# Patient Record
Sex: Male | Born: 1975
Health system: Southern US, Community
[De-identification: ages and names within clinical notes are randomized; demographics above are authoritative.]

## PROBLEM LIST (undated history)

## (undated) DIAGNOSIS — N200 Calculus of kidney: Secondary | ICD-10-CM

## (undated) HISTORY — PX: APPENDECTOMY: SHX54

## (undated) HISTORY — DX: Calculus of kidney: N20.0

---

## 2019-07-12 DIAGNOSIS — E785 Hyperlipidemia, unspecified: Secondary | ICD-10-CM | POA: Diagnosis not present

## 2019-07-12 DIAGNOSIS — E559 Vitamin D deficiency, unspecified: Secondary | ICD-10-CM | POA: Diagnosis not present

## 2019-08-16 DIAGNOSIS — E663 Overweight: Secondary | ICD-10-CM | POA: Diagnosis not present

## 2019-08-16 DIAGNOSIS — Z6828 Body mass index (BMI) 28.0-28.9, adult: Secondary | ICD-10-CM | POA: Diagnosis not present

## 2019-08-16 DIAGNOSIS — Z0000 Encounter for general adult medical examination without abnormal findings: Secondary | ICD-10-CM | POA: Diagnosis not present

## 2019-08-16 DIAGNOSIS — Z1331 Encounter for screening for depression: Secondary | ICD-10-CM | POA: Diagnosis not present

## 2020-02-28 ENCOUNTER — Other Ambulatory Visit: Payer: Self-pay

## 2020-02-28 ENCOUNTER — Encounter (HOSPITAL_COMMUNITY): Payer: Self-pay | Admitting: Emergency Medicine

## 2020-02-28 DIAGNOSIS — N202 Calculus of kidney with calculus of ureter: Secondary | ICD-10-CM | POA: Diagnosis not present

## 2020-02-28 DIAGNOSIS — R1031 Right lower quadrant pain: Secondary | ICD-10-CM | POA: Diagnosis not present

## 2020-02-28 DIAGNOSIS — I2510 Atherosclerotic heart disease of native coronary artery without angina pectoris: Secondary | ICD-10-CM | POA: Diagnosis not present

## 2020-02-28 DIAGNOSIS — R030 Elevated blood-pressure reading, without diagnosis of hypertension: Secondary | ICD-10-CM | POA: Diagnosis not present

## 2020-02-28 DIAGNOSIS — I700 Atherosclerosis of aorta: Secondary | ICD-10-CM | POA: Diagnosis not present

## 2020-02-28 DIAGNOSIS — N289 Disorder of kidney and ureter, unspecified: Secondary | ICD-10-CM | POA: Diagnosis not present

## 2020-02-28 DIAGNOSIS — R93421 Abnormal radiologic findings on diagnostic imaging of right kidney: Secondary | ICD-10-CM | POA: Diagnosis not present

## 2020-02-28 DIAGNOSIS — R7302 Impaired glucose tolerance (oral): Secondary | ICD-10-CM | POA: Diagnosis not present

## 2020-02-28 DIAGNOSIS — R1084 Generalized abdominal pain: Secondary | ICD-10-CM | POA: Diagnosis not present

## 2020-02-28 DIAGNOSIS — K5900 Constipation, unspecified: Secondary | ICD-10-CM | POA: Diagnosis not present

## 2020-02-28 DIAGNOSIS — K3189 Other diseases of stomach and duodenum: Secondary | ICD-10-CM | POA: Diagnosis not present

## 2020-02-28 DIAGNOSIS — K760 Fatty (change of) liver, not elsewhere classified: Secondary | ICD-10-CM | POA: Diagnosis not present

## 2020-02-28 DIAGNOSIS — N201 Calculus of ureter: Secondary | ICD-10-CM | POA: Insufficient documentation

## 2020-02-28 DIAGNOSIS — N23 Unspecified renal colic: Secondary | ICD-10-CM | POA: Diagnosis not present

## 2020-02-28 DIAGNOSIS — K5901 Slow transit constipation: Secondary | ICD-10-CM | POA: Diagnosis not present

## 2020-02-28 DIAGNOSIS — E785 Hyperlipidemia, unspecified: Secondary | ICD-10-CM | POA: Diagnosis not present

## 2020-02-28 DIAGNOSIS — N2882 Megaloureter: Secondary | ICD-10-CM | POA: Diagnosis not present

## 2020-02-28 LAB — BASIC METABOLIC PANEL
Anion gap: 15 (ref 5–15)
BUN: 19 mg/dL (ref 6–20)
CO2: 24 mmol/L (ref 22–32)
Calcium: 9.7 mg/dL (ref 8.9–10.3)
Chloride: 97 mmol/L — ABNORMAL LOW (ref 98–111)
Creatinine, Ser: 1.6 mg/dL — ABNORMAL HIGH (ref 0.61–1.24)
GFR, Estimated: 52 mL/min — ABNORMAL LOW (ref >60)
Glucose, Bld: 94 mg/dL (ref 70–99)
Potassium: 4 mmol/L (ref 3.5–5.1)
Sodium: 136 mmol/L (ref 135–145)

## 2020-02-28 LAB — CBC
HCT: 46.9 % (ref 39.0–52.0)
Hemoglobin: 15.8 g/dL (ref 13.0–17.0)
MCH: 30.6 pg (ref 26.0–34.0)
MCHC: 33.7 g/dL (ref 30.0–36.0)
MCV: 90.7 fL (ref 80.0–100.0)
Platelets: 220 10*3/uL (ref 150–400)
RBC: 5.17 MIL/uL (ref 4.22–5.81)
RDW: 12.8 % (ref 11.5–15.5)
WBC: 12.3 10*3/uL — ABNORMAL HIGH (ref 4.0–10.5)
nRBC: 0 % (ref 0.0–0.2)

## 2020-02-28 LAB — URINALYSIS, ROUTINE W REFLEX MICROSCOPIC
Bilirubin Urine: NEGATIVE
Glucose, UA: NEGATIVE mg/dL
Hgb urine dipstick: NEGATIVE
Ketones, ur: 20 mg/dL — AB
Leukocytes,Ua: NEGATIVE
Nitrite: NEGATIVE
Protein, ur: NEGATIVE mg/dL
Specific Gravity, Urine: 1.015 (ref 1.005–1.030)
pH: 5 (ref 5.0–8.0)

## 2020-02-28 NOTE — ED Triage Notes (Signed)
Patient is stating that he went to his PCP. Patient states he had a ct done. Patient states that he has 5 mm stone on right flank pain.

## 2020-02-29 ENCOUNTER — Emergency Department (HOSPITAL_COMMUNITY)
Admission: EM | Admit: 2020-02-29 | Discharge: 2020-02-29 | Disposition: A | Discharge status: Home / Self Care | Payer: BC Managed Care – PPO | Attending: Emergency Medicine | Admitting: Emergency Medicine

## 2020-02-29 DIAGNOSIS — R030 Elevated blood-pressure reading, without diagnosis of hypertension: Secondary | ICD-10-CM

## 2020-02-29 DIAGNOSIS — N201 Calculus of ureter: Secondary | ICD-10-CM | POA: Diagnosis not present

## 2020-02-29 DIAGNOSIS — N289 Disorder of kidney and ureter, unspecified: Secondary | ICD-10-CM

## 2020-02-29 MED ORDER — ONDANSETRON HCL 4 MG PO TABS: 4.0000 mg | ORAL_TABLET | Freq: Four times a day (QID) | ORAL | 0 refills | Status: DC | PRN

## 2020-02-29 MED ORDER — BISACODYL 5 MG PO TBEC
5.0000 mg | DELAYED_RELEASE_TABLET | Freq: Once | ORAL | Status: AC
  Administered 2020-02-29: 5 mg via ORAL
  Filled 2020-02-29: qty 1

## 2020-02-29 MED ORDER — OXYCODONE-ACETAMINOPHEN 5-325 MG PO TABS
1.0000 | ORAL_TABLET | Freq: Once | ORAL | Status: AC
  Administered 2020-02-29: 1 via ORAL
  Filled 2020-02-29: qty 1

## 2020-02-29 MED ORDER — TAMSULOSIN HCL 0.4 MG PO CAPS: 0.4000 mg | ORAL_CAPSULE | Freq: Every day | ORAL | 0 refills | Status: DC

## 2020-02-29 MED ORDER — ONDANSETRON HCL 4 MG PO TABS
4.0000 mg | ORAL_TABLET | Freq: Once | ORAL | Status: AC
  Administered 2020-02-29: 4 mg via ORAL
  Filled 2020-02-29: qty 1

## 2020-02-29 MED ORDER — OXYCODONE-ACETAMINOPHEN 5-325 MG PO TABS
1.0000 | ORAL_TABLET | ORAL | 0 refills | Status: DC | PRN
Start: 2020-02-29 — End: 2020-04-26

## 2020-02-29 NOTE — Discharge Instructions (Signed)
Drink plenty of fluids.  Return if you develop a fever, or if pain is not being adequately controlled at home.  Your blood pressure was high today.  That may be because of the amount of pain you were in.  However, you need to have your blood pressure checked several times in the next 1-2 weeks to make sure that it is coming down.  If your blood pressure is remaining high, you will need to be on medication to control it.  Inadequately controlled blood pressure can lead to heart attacks, strokes, kidney failure.

## 2020-02-29 NOTE — ED Provider Notes (Signed)
[facility]-EMERGENCY DEPT Provider Note   CSN: [id number] Arrival date & time: 02/28/20  1859   History Chief Complaint  Patient presents with  . Flank Pain    Peter Cooper is a 44 y.o. male.  The history is provided by the patient.  Flank Pain  He has no significant past history and was referred here because of a kidney stone.  He has had right lower quadrant pain for the last 5 days.  There is some radiation of pain to the back.  Pain is severe and he rates it at 10/10.  Nothing makes it better, nothing makes it worse.  He denies fever, chills, sweats.  There has been nausea and dry heaves, but no vomiting.  He has not had any difficulty urinating, but states that he has not had a bowel movement in the last 3 days.  His physician sent him for a CT scan which showed a 5 mm calculus and he was referred here for emergent management of the calculus.  He is status post appendectomy, no prior history of kidney stones.  History reviewed. No pertinent past medical history.  There are no problems to display for this patient.   Past Surgical History:  Procedure Laterality Date  . APPENDECTOMY         History reviewed. No pertinent family history.  Social History   Tobacco Use  . Smoking status: Never Smoker  . Smokeless tobacco: Never Used  Vaping Use  . Vaping Use: Never used  Substance Use Topics  . Alcohol use: Never  . Drug use: Never    Home Medications Prior to Admission medications   Not on File    Allergies    Amoxicillin  Review of Systems   Review of Systems  Genitourinary: Positive for flank pain.  All other systems reviewed and are negative.   Physical Exam Updated Vital Signs BP (!) 146/99 (BP Location: Right Arm)   Pulse 72   Temp 98.5 F (36.9 C) (Oral)   Resp 16   Ht 5\' 11"  (1.803 m)   Wt 80.3 kg   SpO2 99%   BMI 24.69 kg/m   Physical Exam Vitals and nursing note reviewed.   44 year old male, appears mildly  uncomfortable, but is in no acute distress. Vital signs are significant for elevated blood pressure. Oxygen saturation is 99%, which is normal. Head is normocephalic and atraumatic. PERRLA, EOMI. Oropharynx is clear. Neck is nontender and supple without adenopathy or JVD. Back is nontender in the midline.  There is mild right CVA tenderness. Lungs are clear without rales, wheezes, or rhonchi. Chest is nontender. Heart has regular rate and rhythm without murmur. Abdomen is soft, flat, with mild right lower quadrant tenderness.  There is no rebound or guarding.  There are no masses or hepatosplenomegaly and peristalsis is hypoactive.  Right lower quadrant surgical scar is present consistent with history of prior appendectomy. Extremities have no cyanosis or edema, full range of motion is present. Skin is warm and dry without rash. Neurologic: Mental status is normal, cranial nerves are intact, there are no motor or sensory deficits.  ED Results / Procedures / Treatments   Labs (all labs ordered are listed, but only abnormal results are displayed) Labs Reviewed  URINALYSIS, ROUTINE W REFLEX MICROSCOPIC - Abnormal; Notable for the following components:      Result Value   Ketones, ur 20 (*)    All other components within normal limits  BASIC METABOLIC PANEL -  Abnormal; Notable for the following components:   Chloride 97 (*)    Creatinine, Ser 1.60 (*)    GFR, Estimated 52 (*)    All other components within normal limits  CBC - Abnormal; Notable for the following components:   WBC 12.3 (*)    All other components within normal limits   Radiology No results found.  Procedures Procedures  Medications Ordered in ED Medications  bisacodyl (DULCOLAX) EC tablet 5 mg (has no administration in time range)  oxyCODONE-acetaminophen (PERCOCET/ROXICET) 5-325 MG per tablet 1 tablet (has no administration in time range)  ondansetron (ZOFRAN) tablet 4 mg (has no administration in time range)     ED Course  I have reviewed the triage vital signs and the nursing notes.  Pertinent labs & imaging results that were available during my care of the patient were reviewed by me and considered in my medical decision making (see chart for details).  MDM Rules/Calculators/A&P Right-sided ureterolithiasis with ureteral colic.  I have reviewed the CT scan which does show a 5 mm calculus in the right ureter at the junction between the middle and distal thirds, but only minimal hydronephrosis.  No other renal calculi seen.  In spite of history of no bowel movement for 3 days, no increase in stool burden noted.  Labs here showed mild elevation of creatinine, no prior to tell if this is new or not.  Unilateral calculus unlikely to cause significant change in renal function.  Urinalysis is unremarkable.  At this point, no indication for emergent stent placement.  I have explained this to the patient and plan to refer him to urology for outpatient management.  He is given an dose of oxycodone-acetaminophen and a dose of ondansetron.  Given 3 days without bowel movement and the fact that he will be on narcotics, he is given a dose of bisacodyl as well.  He has no prior records in the [facility] health system or in care everywhere.  He had reasonable relief of symptoms with above-noted treatment.  He is discharged with prescriptions for oxycodone-acetaminophen, ondansetron, tamsulosin.  He is referred to urology for follow-up for possible outpatient lithotripsy.  Return precautions discussed.  Also advised to have blood pressure rechecked as an outpatient.  Final Clinical Impression(s) / ED Diagnoses Final diagnoses:  Ureterolithiasis  Elevated blood-pressure reading without diagnosis of hypertension  Renal insufficiency    Rx / DC Orders ED Discharge Orders         Ordered    oxyCODONE-acetaminophen (PERCOCET) 5-325 MG tablet  Every 4 hours PRN        02/29/20 0502    ondansetron (ZOFRAN) 4 MG tablet   Every 6 hours PRN        02/29/20 0502    tamsulosin (FLOMAX) 0.4 MG CAPS capsule  Daily        02/29/20 0502           Dione Booze, MD 02/29/20 [phone]

## 2020-03-01 ENCOUNTER — Ambulatory Visit (INDEPENDENT_AMBULATORY_CARE_PROVIDER_SITE_OTHER): Payer: BC Managed Care – PPO | Admitting: Urology

## 2020-03-01 ENCOUNTER — Other Ambulatory Visit: Payer: Self-pay

## 2020-03-01 VITALS — BP 163/99 | HR 73 | Temp 98.1°F | Ht 71.0 in | Wt 177.0 lb

## 2020-03-01 DIAGNOSIS — N200 Calculus of kidney: Secondary | ICD-10-CM

## 2020-03-01 MED ORDER — ONDANSETRON HCL 4 MG/2ML IJ SOLN: 4.0000 mg | Freq: Once | INTRAMUSCULAR | Status: AC

## 2020-03-01 MED ORDER — DIAZEPAM 5 MG PO TABS: 10.0000 mg | ORAL_TABLET | ORAL | Status: AC

## 2020-03-01 MED ORDER — SODIUM CHLORIDE 0.9 % IV SOLN: INTRAVENOUS | Status: DC

## 2020-03-01 MED ORDER — DIPHENHYDRAMINE HCL 25 MG PO CAPS: 25.0000 mg | ORAL_CAPSULE | ORAL | Status: AC

## 2020-03-01 NOTE — H&P (View-Only) (Signed)
03/01/2020 8:01 AM   Peter Cooper Oct 08, 1975 [id number]  Referring provider: No referring provider defined for this encounter. Chief Complaint  Patient presents with  . Nephrolithiasis    New Patient    HPI: Peter Cooper is a 44 y.o. male who presents today for evaluation and management of kidney stones. Patient is accompanied by his wife.   He saw his PCP on 02/28/2020 and was sent the to ED for flank pain. Right lower abdominal pain developed last Thursday and gradually worsened with associated nausea and vomiting. He had urinary frequency, diarrhea and constipation. No fevers or chills. No hematuria. Patient had back pain. Patient was given tamsulosin, ondansetron, and Percocet.   UA without sign of infection. Mild leukocytes. Creatinine at 1.6 (no baseline available). CT A/P showed a 5 mm distal obstructing right ureteral stone.   Patient saw Dr. Annabell Howells at [facility] on 02/29/2020. Patient was scheduled for ESWL on Monday but there were no openings resulting in patient being sent to [facility]. KUB and CT were reviewed by Dr. Annabell Howells.  He notes slight distal migration of the ureteral calculus on KUB in the office.  No personal history of kidney stones. Sister had urolithiasis.   PMH: No past medical history on file.  Surgical History: Past Surgical History:  Procedure Laterality Date  . APPENDECTOMY      Home Medications:  Allergies as of 03/01/2020      Reactions   Amoxicillin Rash      Medication List       Accurate as of March 01, 2020 11:59 PM. If you have any questions, ask your nurse or doctor.        acetaminophen 500 MG tablet Commonly known as: TYLENOL Take 1,000 mg by mouth every 6 (six) hours as needed for mild pain or moderate pain.   ondansetron 4 MG tablet Commonly known as: ZOFRAN Take 1 tablet (4 mg total) by mouth every 6 (six) hours as needed for nausea.   oxyCODONE-acetaminophen 5-325 MG tablet Commonly known as: Percocet Take 1 tablet  by mouth every 4 (four) hours as needed for moderate pain.   tamsulosin 0.4 MG Caps capsule Commonly known as: FLOMAX Take 1 capsule (0.4 mg total) by mouth daily.       Allergies:  Allergies  Allergen Reactions  . Amoxicillin Rash    Family History: No family history on file.  Social History:  reports that he has never smoked. He has never used smokeless tobacco. He reports that he does not drink alcohol and does not use drugs.   Physical Exam: BP (!) 163/99   Pulse 73   Temp 98.1 F (36.7 C)   Ht 5\' 11"  (1.803 m)   Wt 177 lb (80.3 kg)   BMI 24.69 kg/m   Constitutional:  Alert and oriented, No acute distress. HEENT: Opp AT, moist mucus membranes.  Trachea midline, no masses. Cardiovascular: No clubbing, cyanosis, or edema. Respiratory: Normal respiratory effort, no increased work of breathing. Skin: No rashes, bruises or suspicious lesions. Neurologic: Grossly intact, no focal deficits, moving all 4 extremities. Psychiatric: Normal mood and affect.  Laboratory Data:  Lab Results  Component Value Date   CREATININE 1.60 (H) 02/28/2020    Assessment & Plan:    1. Right distal obstructing ureteral stone  Refractory pain from 5 mm distal stone, interested in prompt intervention.  Poor p.o. intake.  We discussed various treatment options including ESWL vs. ureteroscopy, laser lithotripsy, and stent. We discussed the risks and  benefits of both including bleeding, infection, damage to surrounding structures, efficacy with need for possible further intervention, and need for temporary ureteral stent.  Patient elected right shock wave.    2. Acute kidney injury Likely related to obstruction/prerenal causes  Recheck after stone is treated   [facility] [address] [place], [place] [address] [phone]  I, Theador Hawthorne, am acting as a scribe for Dr. Vanna Scotland.

## 2020-03-01 NOTE — Progress Notes (Signed)
03/01/2020 8:01 AM   Peter Cooper Oct 08, 1975 [id number]  Referring provider: No referring provider defined for this encounter. Chief Complaint  Patient presents with  . Nephrolithiasis    New Patient    HPI: Peter Cooper is a 44 y.o. male who presents today for evaluation and management of kidney stones. Patient is accompanied by his wife.   He saw his PCP on 02/28/2020 and was sent the to ED for flank pain. Right lower abdominal pain developed last Thursday and gradually worsened with associated nausea and vomiting. He had urinary frequency, diarrhea and constipation. No fevers or chills. No hematuria. Patient had back pain. Patient was given tamsulosin, ondansetron, and Percocet.   UA without sign of infection. Mild leukocytes. Creatinine at 1.6 (no baseline available). CT A/P showed a 5 mm distal obstructing right ureteral stone.   Patient saw Dr. Annabell Howells at [facility] on 02/29/2020. Patient was scheduled for ESWL on Monday but there were no openings resulting in patient being sent to [facility]. KUB and CT were reviewed by Dr. Annabell Howells.  He notes slight distal migration of the ureteral calculus on KUB in the office.  No personal history of kidney stones. Sister had urolithiasis.   PMH: No past medical history on file.  Surgical History: Past Surgical History:  Procedure Laterality Date  . APPENDECTOMY      Home Medications:  Allergies as of 03/01/2020      Reactions   Amoxicillin Rash      Medication List       Accurate as of March 01, 2020 11:59 PM. If you have any questions, ask your nurse or doctor.        acetaminophen 500 MG tablet Commonly known as: TYLENOL Take 1,000 mg by mouth every 6 (six) hours as needed for mild pain or moderate pain.   ondansetron 4 MG tablet Commonly known as: ZOFRAN Take 1 tablet (4 mg total) by mouth every 6 (six) hours as needed for nausea.   oxyCODONE-acetaminophen 5-325 MG tablet Commonly known as: Percocet Take 1 tablet  by mouth every 4 (four) hours as needed for moderate pain.   tamsulosin 0.4 MG Caps capsule Commonly known as: FLOMAX Take 1 capsule (0.4 mg total) by mouth daily.       Allergies:  Allergies  Allergen Reactions  . Amoxicillin Rash    Family History: No family history on file.  Social History:  reports that he has never smoked. He has never used smokeless tobacco. He reports that he does not drink alcohol and does not use drugs.   Physical Exam: BP (!) 163/99   Pulse 73   Temp 98.1 F (36.7 C)   Ht 5\' 11"  (1.803 m)   Wt 177 lb (80.3 kg)   BMI 24.69 kg/m   Constitutional:  Alert and oriented, No acute distress. HEENT: Opp AT, moist mucus membranes.  Trachea midline, no masses. Cardiovascular: No clubbing, cyanosis, or edema. Respiratory: Normal respiratory effort, no increased work of breathing. Skin: No rashes, bruises or suspicious lesions. Neurologic: Grossly intact, no focal deficits, moving all 4 extremities. Psychiatric: Normal mood and affect.  Laboratory Data:  Lab Results  Component Value Date   CREATININE 1.60 (H) 02/28/2020    Assessment & Plan:    1. Right distal obstructing ureteral stone  Refractory pain from 5 mm distal stone, interested in prompt intervention.  Poor p.o. intake.  We discussed various treatment options including ESWL vs. ureteroscopy, laser lithotripsy, and stent. We discussed the risks and  benefits of both including bleeding, infection, damage to surrounding structures, efficacy with need for possible further intervention, and need for temporary ureteral stent.  Patient elected right shock wave.    2. Acute kidney injury Likely related to obstruction/prerenal causes  Recheck after stone is treated   [facility] [address], [address] [place], [place] [address] [phone]  I, Alexis Patterson, am acting as a scribe for Dr. Hilman Kissling.     

## 2020-03-02 ENCOUNTER — Ambulatory Visit: Payer: BC Managed Care – PPO

## 2020-03-02 ENCOUNTER — Encounter: Payer: Self-pay | Admitting: Urology

## 2020-03-02 ENCOUNTER — Ambulatory Visit: Admit: 2020-03-02 | Payer: BC Managed Care – PPO | Admitting: Urology

## 2020-03-02 ENCOUNTER — Ambulatory Visit
Admission: RE | Admit: 2020-03-02 | Discharge: 2020-03-02 | Disposition: A | Discharge status: Home / Self Care | Payer: BC Managed Care – PPO | Source: Ambulatory Visit | Attending: Urology | Admitting: Urology

## 2020-03-02 ENCOUNTER — Encounter
Admission: RE | Disposition: A | Discharge status: Home / Self Care | Payer: Self-pay | Source: Ambulatory Visit | Attending: Urology

## 2020-03-02 ENCOUNTER — Other Ambulatory Visit: Payer: Self-pay

## 2020-03-02 ENCOUNTER — Ambulatory Visit: Payer: BC Managed Care – PPO | Admitting: Anesthesiology

## 2020-03-02 DIAGNOSIS — N202 Calculus of kidney with calculus of ureter: Secondary | ICD-10-CM | POA: Insufficient documentation

## 2020-03-02 DIAGNOSIS — Z20822 Contact with and (suspected) exposure to covid-19: Secondary | ICD-10-CM | POA: Insufficient documentation

## 2020-03-02 DIAGNOSIS — N201 Calculus of ureter: Secondary | ICD-10-CM | POA: Diagnosis not present

## 2020-03-02 DIAGNOSIS — N179 Acute kidney failure, unspecified: Secondary | ICD-10-CM | POA: Diagnosis not present

## 2020-03-02 DIAGNOSIS — Z01818 Encounter for other preprocedural examination: Secondary | ICD-10-CM | POA: Diagnosis not present

## 2020-03-02 DIAGNOSIS — Z880 Allergy status to penicillin: Secondary | ICD-10-CM | POA: Diagnosis not present

## 2020-03-02 DIAGNOSIS — N200 Calculus of kidney: Secondary | ICD-10-CM

## 2020-03-02 DIAGNOSIS — Z87442 Personal history of urinary calculi: Secondary | ICD-10-CM | POA: Diagnosis not present

## 2020-03-02 HISTORY — PX: CYSTOSCOPY/URETEROSCOPY/HOLMIUM LASER/STENT PLACEMENT: SHX6546

## 2020-03-02 HISTORY — PX: CYSTOSCOPY W/ RETROGRADES: SHX1426

## 2020-03-02 LAB — URINALYSIS, COMPLETE
Bilirubin, UA: NEGATIVE
Glucose, UA: NEGATIVE
Leukocytes,UA: NEGATIVE
Nitrite, UA: NEGATIVE
RBC, UA: NEGATIVE
Specific Gravity, UA: 1.025 (ref 1.005–1.030)
Urobilinogen, Ur: 0.2 mg/dL (ref 0.2–1.0)
pH, UA: 5 (ref 5.0–7.5)

## 2020-03-02 LAB — SARS CORONAVIRUS 2 BY RT PCR (HOSPITAL ORDER, PERFORMED IN ~~LOC~~ HOSPITAL LAB): SARS Coronavirus 2: NEGATIVE

## 2020-03-02 LAB — MICROSCOPIC EXAMINATION: Bacteria, UA: NONE SEEN

## 2020-03-02 SURGERY — LITHOTRIPSY, ESWL
Anesthesia: Moderate Sedation | Laterality: Right

## 2020-03-02 SURGERY — CYSTOSCOPY/URETEROSCOPY/HOLMIUM LASER/STENT PLACEMENT
Anesthesia: General | Laterality: Right

## 2020-03-02 MED ORDER — FENTANYL CITRATE (PF) 100 MCG/2ML IJ SOLN
INTRAMUSCULAR | Status: DC | PRN
  Administered 2020-03-02: 25 ug via INTRAVENOUS
  Administered 2020-03-02: 50 ug via INTRAVENOUS

## 2020-03-02 MED ORDER — FENTANYL CITRATE (PF) 100 MCG/2ML IJ SOLN: 25.0000 ug | INTRAMUSCULAR | Status: DC | PRN

## 2020-03-02 MED ORDER — DEXMEDETOMIDINE (PRECEDEX) IN NS 20 MCG/5ML (4 MCG/ML) IV SYRINGE
PREFILLED_SYRINGE | INTRAVENOUS | Status: DC | PRN
  Administered 2020-03-02 (×2): 8 ug via INTRAVENOUS
  Administered 2020-03-02: 4 ug via INTRAVENOUS

## 2020-03-02 MED ORDER — DEXAMETHASONE SODIUM PHOSPHATE 10 MG/ML IJ SOLN
INTRAMUSCULAR | Status: AC
  Filled 2020-03-02: qty 1

## 2020-03-02 MED ORDER — SUGAMMADEX SODIUM 200 MG/2ML IV SOLN
INTRAVENOUS | Status: DC | PRN
  Administered 2020-03-02: 170 mg via INTRAVENOUS

## 2020-03-02 MED ORDER — ACETAMINOPHEN 10 MG/ML IV SOLN: 1000.0000 mg | Freq: Once | INTRAVENOUS | Status: DC | PRN

## 2020-03-02 MED ORDER — CIPROFLOXACIN IN D5W 400 MG/200ML IV SOLN
400.0000 mg | Freq: Once | INTRAVENOUS | Status: AC
  Administered 2020-03-02: 400 mg via INTRAVENOUS

## 2020-03-02 MED ORDER — MIDAZOLAM HCL 2 MG/2ML IJ SOLN
INTRAMUSCULAR | Status: DC | PRN
  Administered 2020-03-02: 2 mg via INTRAVENOUS

## 2020-03-02 MED ORDER — FENTANYL CITRATE (PF) 100 MCG/2ML IJ SOLN
INTRAMUSCULAR | Status: AC
  Filled 2020-03-02: qty 2

## 2020-03-02 MED ORDER — ONDANSETRON HCL 4 MG/2ML IJ SOLN
INTRAMUSCULAR | Status: AC
  Filled 2020-03-02: qty 2

## 2020-03-02 MED ORDER — PROPOFOL 10 MG/ML IV BOLUS
INTRAVENOUS | Status: AC
  Filled 2020-03-02: qty 20

## 2020-03-02 MED ORDER — OXYCODONE HCL 5 MG PO TABS: 5.0000 mg | ORAL_TABLET | Freq: Once | ORAL | Status: DC | PRN

## 2020-03-02 MED ORDER — BELLADONNA ALKALOIDS-OPIUM 16.2-60 MG RE SUPP
RECTAL | Status: AC
  Filled 2020-03-02: qty 1

## 2020-03-02 MED ORDER — DEXAMETHASONE SODIUM PHOSPHATE 10 MG/ML IJ SOLN
INTRAMUSCULAR | Status: DC | PRN
  Administered 2020-03-02: 10 mg via INTRAVENOUS

## 2020-03-02 MED ORDER — DIAZEPAM 5 MG PO TABS: 5.0000 mg | ORAL_TABLET | Freq: Once | ORAL | 0 refills | Status: DC | PRN

## 2020-03-02 MED ORDER — ROCURONIUM BROMIDE 100 MG/10ML IV SOLN
INTRAVENOUS | Status: DC | PRN
  Administered 2020-03-02: 10 mg via INTRAVENOUS

## 2020-03-02 MED ORDER — DIAZEPAM 5 MG PO TABS
ORAL_TABLET | ORAL | Status: AC
  Administered 2020-03-02: 10 mg via ORAL
  Filled 2020-03-02: qty 2

## 2020-03-02 MED ORDER — FENTANYL CITRATE (PF) 100 MCG/2ML IJ SOLN
INTRAMUSCULAR | Status: AC
  Administered 2020-03-02: 25 ug via INTRAVENOUS
  Filled 2020-03-02: qty 2

## 2020-03-02 MED ORDER — OXYCODONE HCL 5 MG/5ML PO SOLN: 5.0000 mg | Freq: Once | ORAL | Status: DC | PRN

## 2020-03-02 MED ORDER — ONDANSETRON HCL 4 MG/2ML IJ SOLN
INTRAMUSCULAR | Status: AC
  Administered 2020-03-02: 4 mg via INTRAVENOUS
  Filled 2020-03-02: qty 2

## 2020-03-02 MED ORDER — KETOROLAC TROMETHAMINE 30 MG/ML IJ SOLN
INTRAMUSCULAR | Status: DC | PRN
  Administered 2020-03-02: 15 mg via INTRAVENOUS

## 2020-03-02 MED ORDER — SCOPOLAMINE 1 MG/3DAYS TD PT72
MEDICATED_PATCH | TRANSDERMAL | Status: AC
  Filled 2020-03-02: qty 1

## 2020-03-02 MED ORDER — SUCCINYLCHOLINE CHLORIDE 20 MG/ML IJ SOLN
INTRAMUSCULAR | Status: DC | PRN
  Administered 2020-03-02: 100 mg via INTRAVENOUS

## 2020-03-02 MED ORDER — OXYBUTYNIN CHLORIDE ER 10 MG PO TB24: 10.0000 mg | ORAL_TABLET | Freq: Every day | ORAL | 0 refills | Status: AC | PRN

## 2020-03-02 MED ORDER — SENNOSIDES-DOCUSATE SODIUM 8.6-50 MG PO TABS: 1.0000 | ORAL_TABLET | Freq: Two times a day (BID) | ORAL | 0 refills | Status: DC

## 2020-03-02 MED ORDER — MIDAZOLAM HCL 2 MG/2ML IJ SOLN
INTRAMUSCULAR | Status: AC
  Filled 2020-03-02: qty 2

## 2020-03-02 MED ORDER — PROPOFOL 10 MG/ML IV BOLUS
INTRAVENOUS | Status: DC | PRN
  Administered 2020-03-02: 150 mg via INTRAVENOUS

## 2020-03-02 MED ORDER — FENTANYL CITRATE (PF) 100 MCG/2ML IJ SOLN
25.0000 ug | Freq: Once | INTRAMUSCULAR | Status: AC
  Administered 2020-03-02: 25 ug via INTRAVENOUS

## 2020-03-02 MED ORDER — SUCCINYLCHOLINE CHLORIDE 200 MG/10ML IV SOSY
PREFILLED_SYRINGE | INTRAVENOUS | Status: AC
  Filled 2020-03-02: qty 10

## 2020-03-02 MED ORDER — SCOPOLAMINE 1 MG/3DAYS TD PT72
1.0000 | MEDICATED_PATCH | TRANSDERMAL | Status: DC
  Administered 2020-03-02: 1.5 mg via TRANSDERMAL

## 2020-03-02 MED ORDER — CIPROFLOXACIN IN D5W 400 MG/200ML IV SOLN
INTRAVENOUS | Status: AC
  Filled 2020-03-02: qty 200

## 2020-03-02 MED ORDER — DIPHENHYDRAMINE HCL 25 MG PO CAPS
ORAL_CAPSULE | ORAL | Status: AC
  Administered 2020-03-02: 25 mg via ORAL
  Filled 2020-03-02: qty 1

## 2020-03-02 MED ORDER — IOHEXOL 180 MG/ML  SOLN
INTRAMUSCULAR | Status: DC | PRN
  Administered 2020-03-02: 20 mL

## 2020-03-02 MED ORDER — OXYCODONE-ACETAMINOPHEN 5-325 MG PO TABS
1.0000 | ORAL_TABLET | ORAL | 0 refills | Status: AC | PRN
Start: 2020-03-02 — End: 2020-03-05

## 2020-03-02 MED ORDER — LIDOCAINE HCL (CARDIAC) PF 100 MG/5ML IV SOSY
PREFILLED_SYRINGE | INTRAVENOUS | Status: DC | PRN
  Administered 2020-03-02: 80 mg via INTRAVENOUS

## 2020-03-02 SURGICAL SUPPLY — 34 items
BAG DRAIN CYSTO-URO LG1000N (MISCELLANEOUS) ×4 IMPLANT
BRUSH SCRUB EZ 1% IODOPHOR (MISCELLANEOUS) ×4 IMPLANT
CATH URET FLEX-TIP 2 LUMEN 10F (CATHETERS) ×4 IMPLANT
CATH URETL 5X70 OPEN END (CATHETERS) IMPLANT
CNTNR SPEC 2.5X3XGRAD LEK (MISCELLANEOUS) ×2
CONT SPEC 4OZ STER OR WHT (MISCELLANEOUS) ×2
CONT SPEC 4OZ STRL OR WHT (MISCELLANEOUS) ×2
CONTAINER SPEC 2.5X3XGRAD LEK (MISCELLANEOUS) ×2 IMPLANT
DRAPE UTILITY 15X26 TOWEL STRL (DRAPES) ×4 IMPLANT
FIBER LASER TRAC TIP (UROLOGICAL SUPPLIES) IMPLANT
FIBER LASER TRACTIP 200 (UROLOGICAL SUPPLIES) ×4 IMPLANT
GLOVE BIOGEL PI IND STRL 7.5 (GLOVE) ×2 IMPLANT
GLOVE BIOGEL PI INDICATOR 7.5 (GLOVE) ×2
GOWN STRL REUS W/ TWL LRG LVL3 (GOWN DISPOSABLE) ×2 IMPLANT
GOWN STRL REUS W/ TWL XL LVL3 (GOWN DISPOSABLE) ×2 IMPLANT
GOWN STRL REUS W/TWL LRG LVL3 (GOWN DISPOSABLE) ×4
GOWN STRL REUS W/TWL XL LVL3 (GOWN DISPOSABLE) ×4
GUIDEWIRE STR DUAL SENSOR (WIRE) ×8 IMPLANT
INFUSOR MANOMETER BAG 3000ML (MISCELLANEOUS) ×4 IMPLANT
INTRODUCER DILATOR DOUBLE (INTRODUCER) IMPLANT
KIT TURNOVER CYSTO (KITS) ×4 IMPLANT
PACK CYSTO AR (MISCELLANEOUS) ×4 IMPLANT
SET CYSTO W/LG BORE CLAMP LF (SET/KITS/TRAYS/PACK) ×4 IMPLANT
SET DILATOR URETRAL 8.5FR (MISCELLANEOUS) ×4 IMPLANT
SHEATH URETERAL 12FRX35CM (MISCELLANEOUS) IMPLANT
SOL .9 NS 3000ML IRR  AL (IV SOLUTION) ×2
SOL .9 NS 3000ML IRR AL (IV SOLUTION) ×2
SOL .9 NS 3000ML IRR UROMATIC (IV SOLUTION) ×2 IMPLANT
STENT URET 6FRX24 CONTOUR (STENTS) IMPLANT
STENT URET 6FRX26 CONTOUR (STENTS) IMPLANT
SURGILUBE 2OZ TUBE FLIPTOP (MISCELLANEOUS) ×4 IMPLANT
SYR 10ML LL (SYRINGE) ×4 IMPLANT
VALVE UROSEAL ADJ ENDO (VALVE) IMPLANT
WATER STERILE IRR 1000ML POUR (IV SOLUTION) ×4 IMPLANT

## 2020-03-02 NOTE — Anesthesia Preprocedure Evaluation (Signed)
Anesthesia Evaluation  Patient identified by MRN, date of birth, ID band Patient awake  General Assessment Comment:Pt had scheduled ESWL for kidney stone, but found to have stone in position that warranted cystoscopy.  Reviewed: Allergy & Precautions, NPO status , Patient's Chart, lab work & pertinent test results  History of Anesthesia Complications (+) PONVNegative for: history of anesthetic complications  Airway Mallampati: II  TM Distance: >3 FB Neck ROM: Full    Dental no notable dental hx. (+) Teeth Intact   Pulmonary neg pulmonary ROS, neg sleep apnea, neg COPD, Patient abstained from smoking.Not current smoker,    Pulmonary exam normal breath sounds clear to auscultation       Cardiovascular Exercise Tolerance: Good METS(-) hypertension(-) CAD and (-) Past MI negative cardio ROS  (-) dysrhythmias  Rhythm:Regular Rate:Normal - Systolic murmurs    Neuro/Psych negative neurological ROS  negative psych ROS   GI/Hepatic neg GERD  ,(+)     (-) substance abuse  ,   Endo/Other  neg diabetes  Renal/GU negative Renal ROS     Musculoskeletal   Abdominal   Peds  Hematology   Anesthesia Other Findings History reviewed. No pertinent past medical history.  Reproductive/Obstetrics                             Anesthesia Physical Anesthesia Plan  ASA: I  Anesthesia Plan: General   Post-op Pain Management:    Induction: Intravenous  PONV Risk Score and Plan: 4 or greater and Ondansetron, Dexamethasone, Midazolam and Scopolamine patch - Pre-op  Airway Management Planned: Oral ETT  Additional Equipment: None  Intra-op Plan:   Post-operative Plan: Extubation in OR  Informed Consent: I have reviewed the patients History and Physical, chart, labs and discussed the procedure including the risks, benefits and alternatives for the proposed anesthesia with the patient or authorized  representative who has indicated his/her understanding and acceptance.     Dental advisory given  Plan Discussed with: CRNA and Surgeon  Anesthesia Plan Comments: (Discussed risks of anesthesia with patient, including PONV, sore throat, lip/dental damage. Rare risks discussed as well, such as cardiorespiratory and neurological sequelae. Patient understands.)        Anesthesia Quick Evaluation

## 2020-03-02 NOTE — Anesthesia Procedure Notes (Addendum)
Procedure Name: Intubation Date/Time: 03/02/2020 10:43 AM Performed by: Fredderick Phenix, CRNA Pre-anesthesia Checklist: Patient identified, Emergency Drugs available, Suction available and Patient being monitored Patient Re-evaluated:Patient Re-evaluated prior to induction Oxygen Delivery Method: Circle system utilized Preoxygenation: Pre-oxygenation with 100% oxygen Induction Type: IV induction Ventilation: Mask ventilation without difficulty Laryngoscope Size: Mac and 4 Grade View: Grade I Tube type: Oral Tube size: 7.0 mm Number of attempts: 1 Airway Equipment and Method: Stylet Placement Confirmation: ETT inserted through vocal cords under direct vision,  positive ETCO2 and breath sounds checked- equal and bilateral Secured at: 22 cm Tube secured with: Tape Dental Injury: Teeth and Oropharynx as per pre-operative assessment

## 2020-03-02 NOTE — Transfer of Care (Signed)
Immediate Anesthesia Transfer of Care Note  Patient: Carroll Sage  Procedure(s) Performed: CYSTOSCOPY/URETEROSCOPY/HOLMIUM LASER/STENT PLACEMENT (Right ) CYSTOSCOPY WITH RETROGRADE PYELOGRAM  Patient Location: PACU  Anesthesia Type:General  Level of Consciousness: drowsy  Airway & Oxygen Therapy: Patient Spontanous Breathing and Patient connected to face mask oxygen  Post-op Assessment: Report given to RN and Post -op Vital signs reviewed and stable  Post vital signs: Reviewed and stable  Last Vitals:  Vitals Value Taken Time  BP 138/92 03/02/20 1144  Temp    Pulse 67 03/02/20 1147  Resp 13 03/02/20 1147  SpO2 100 % 03/02/20 1147  Vitals shown include unvalidated device data.  Last Pain:  Vitals:   03/02/20 1029  TempSrc:   PainSc: 4          Complications: No complications documented.

## 2020-03-02 NOTE — Anesthesia Postprocedure Evaluation (Signed)
Anesthesia Post Note  Patient: Peter Cooper  Procedure(s) Performed: CYSTOSCOPY/URETEROSCOPY/HOLMIUM LASER/STENT PLACEMENT (Right ) CYSTOSCOPY WITH RETROGRADE PYELOGRAM  Patient location during evaluation: PACU Anesthesia Type: General Level of consciousness: awake and alert Pain management: pain level controlled Vital Signs Assessment: post-procedure vital signs reviewed and stable Respiratory status: spontaneous breathing, nonlabored ventilation, respiratory function stable and patient connected to nasal cannula oxygen Cardiovascular status: blood pressure returned to baseline and stable Postop Assessment: no apparent nausea or vomiting Anesthetic complications: no   No complications documented.   Last Vitals:  Vitals:   03/02/20 1252 03/02/20 1256  BP: (!) 147/111 (!) 146/97  Pulse:    Resp:    Temp: 36.8 C   SpO2: 98%     Last Pain:  Vitals:   03/02/20 1252  TempSrc: Temporal  PainSc:                  Corinda Gubler

## 2020-03-02 NOTE — Op Note (Signed)
Date of procedure: 03/02/20  Preoperative diagnosis:  1. Right 5 mm mid ureteral stone  Postoperative diagnosis:  1. Same  Procedure: 1. Cystoscopy, right retrograde pyelogram with intraoperative interpretation, right ureteroscopy, laser lithotripsy, stent placement  Surgeon: Legrand Rams, MD  Anesthesia: General  Complications: None  Intraoperative findings:  1.  Normal cystoscopy 2.  Impacted right mid ureteral stone dusted and basket extracted, uncomplicated stent placement  EBL: Minimal  Specimens: Stone for analysis  Drains: Right 6 French by 26 cm ureteral stent  Indication: Peter Cooper is a 44 y.o. patient with 5 mm right mid ureteral stone and uncontrolled renal colic.  We discussed the options including observation, shockwave lithotripsy, or ureteroscopy with laser lithotripsy and stent, and he opted for ureteroscopy and laser lithotripsy..  After reviewing the management options for treatment, they elected to proceed with the above surgical procedure(s). We have discussed the potential benefits and risks of the procedure, side effects of the proposed treatment, the likelihood of the patient achieving the goals of the procedure, and any potential problems that might occur during the procedure or recuperation. Informed consent has been obtained.  Description of procedure:  The patient was taken to the operating room and general anesthesia was induced. SCDs were placed for DVT prophylaxis. The patient was placed in the dorsal lithotomy position, prepped and draped in the usual sterile fashion, and preoperative antibiotics(Cipro) were administered. A preoperative time-out was performed.   A 21 French rigid cystoscope was used to intubate the urethra and a normal-appearing urethra was followed proximally into the bladder.  The prostate was small.  Thorough cystoscopy was performed and the bladder was grossly normal, and the ureteral orifices were orthotopic bilaterally.  On  fluoroscopy, there was retained contrast in the colon making visualization of the ureter difficult.  The stone was not clearly seen on fluoroscopy.  I started by advancing a sensor wire into the right ureteral orifice and was able to navigate this alongside the wire up into the kidney.  A semirigid ureteroscope was then advanced alongside the wire, and the distal ureter was extremely tight.  I then removed the scope, and use the 8 French ureteral dilator under fluoroscopic vision to dilate up to the level of the stone.  The semirigid ureteroscope was then advanced into the right ureteral orifice and the distal ureter was very tight and restrictive.  I was ultimately able to carefully navigate up to an impacted black stone located in the mid ureter at the level of the crossing vessels.  Using a 200 m laser fiber on settings of 1.0 J and 10 Hz, the stone was carefully fragmented to dust.  A basket was used to extract all fragments from the ureter.  Thorough inspection of the ureter revealed significant edema in the mid ureter at the level with a stone was impacted.  Retrograde pyelogram was performed and showed no extravasation or filling defects, and outline the collecting system.  The rigid cystoscope was backloaded over the wire, and a 6 [place] by 26 cm ureteral stent was uneventfully placed.  There was a shepherd's hook in the lower pole, and an excellent curl in the bladder.  There was brisk drainage of fluid and contrast through the side ports of the stent.  The bladder was drained and this concluded our procedure.  Stone fragments were sent for analysis.  Belladonna suppository was placed.  Disposition: Stable to PACU  Plan: Follow-up for stent removal in 1 week  Legrand Rams, MD

## 2020-03-02 NOTE — Interval H&P Note (Signed)
UROLOGY H&P UPDATE  [name] is a 44 year old male with severe right renal colic secondary to a 5 mm distal ureteral stone.  He was originally seen by Dr. Annabell Howells in [place], and then by Dr. Apolinar Junes yesterday, and originally planned for right-sided shockwave lithotripsy.  He continues to have right-sided flank pain this morning.  I personally reviewed his KUB today, and stone is not clearly seen, additionally there is significant right-sided contrast retained from prior CT making identification of a right distal ureteral stone very difficult.  We had a long discussion this morning about options including observation with medical expulsive therapy, attempted shockwave lithotripsy with potential risk of failure based on difficulty visualizing stone on KUB today and retained contrast, or proceeding with right-sided ureteroscopy, laser lithotripsy, and stent placement.  We discussed the risk and benefits at length.  He would like to pursue ureteroscopy which I think is very reasonable with his difficult to visualize stone.  Cardiac: RRR Lungs: CTA bilaterally  Laterality: Right Procedure: Right ureteroscopy, laser lithotripsy, stent placement  Urine: Urinalysis 10/18 benign  We specifically discussed the risks ureteroscopy including bleeding, infection/sepsis, stent related symptoms including flank pain/urgency/frequency/incontinence/dysuria, ureteral injury, inability to access stone, or need for staged or additional procedures.   Sondra Come, MD 03/02/2020

## 2020-03-02 NOTE — Discharge Instructions (Signed)

## 2020-03-03 ENCOUNTER — Encounter: Payer: Self-pay | Admitting: Urology

## 2020-03-03 ENCOUNTER — Telehealth: Payer: Self-pay | Admitting: Urology

## 2020-03-03 NOTE — Telephone Encounter (Signed)
Appt made and pt notified. 

## 2020-03-03 NOTE — Telephone Encounter (Signed)
-----   Message from Sondra Come, MD sent at 03/02/2020 11:49 AM EDT ----- Regarding: stent removal Please schedule stent removal with me on 10/28, ok to overbook. Please call his wife, he just had surgery today  Thanks  Legrand Rams, MD 03/02/2020

## 2020-03-06 ENCOUNTER — Ambulatory Visit (INDEPENDENT_AMBULATORY_CARE_PROVIDER_SITE_OTHER): Payer: BC Managed Care – PPO | Admitting: Urology

## 2020-03-06 ENCOUNTER — Encounter: Payer: Self-pay | Admitting: Urology

## 2020-03-06 ENCOUNTER — Telehealth: Payer: Self-pay | Admitting: Urology

## 2020-03-06 ENCOUNTER — Other Ambulatory Visit: Payer: Self-pay

## 2020-03-06 VITALS — BP 117/73 | HR 86 | Ht 71.0 in | Wt 177.4 lb

## 2020-03-06 DIAGNOSIS — N200 Calculus of kidney: Secondary | ICD-10-CM

## 2020-03-06 DIAGNOSIS — Z466 Encounter for fitting and adjustment of urinary device: Secondary | ICD-10-CM | POA: Diagnosis not present

## 2020-03-06 MED ORDER — KETOROLAC TROMETHAMINE 60 MG/2ML IM SOLN
60.0000 mg | Freq: Once | INTRAMUSCULAR | Status: AC
  Administered 2020-03-06: 60 mg via INTRAMUSCULAR

## 2020-03-06 MED ORDER — SULFAMETHOXAZOLE-TRIMETHOPRIM 800-160 MG PO TABS
1.0000 | ORAL_TABLET | Freq: Once | ORAL | Status: AC
  Administered 2020-03-06: 1 via ORAL

## 2020-03-06 MED ORDER — LIDOCAINE HCL URETHRAL/MUCOSAL 2 % EX GEL
1.0000 "application " | Freq: Once | CUTANEOUS | Status: AC
  Administered 2020-03-06: 1 via URETHRAL

## 2020-03-06 NOTE — Patient Instructions (Signed)

## 2020-03-06 NOTE — Telephone Encounter (Signed)
Patient's wife, Amy, called the office this morning.  She states that the patient was up all night complaining of lower back pain, she believes on the right side.  Patient had surgery with Dr. Richardo Hanks on 10/21 and is scheduled to have his stent removed on 10/28.  Please call her at [phone].

## 2020-03-06 NOTE — Telephone Encounter (Signed)
Wife would like to speak to [place] regarding flank pain which has become severe. Patient is nauseated and had to take oxycodone for pain. Had surgery Thursday. Call wife at [phone].

## 2020-03-06 NOTE — Telephone Encounter (Signed)
Called pt's wife per DPR. She states that pt still has not had BM since mag citrate this morning. Pt c/o severe RT flank pain and diffuse abdominal pain. Pain is better with Percocet. Informed provider of situation he states he will call patient to access.

## 2020-03-06 NOTE — Progress Notes (Signed)
Cystoscopy Procedure Note:  Indication: Stent removal s/p right ureteroscopy, laser lithotripsy, stent placement for 5 mm impacted right mid ureteral stone  Was originally scheduled for stent removal on Thursday this week, however he had severe stent pain and requested stent removal early.  He is also had trouble with constipation secondary to narcotics, and we discussed at length strategies including MiraLAX, mag citrate, Senokot, and suppository/enema  After informed consent and discussion of the procedure and its risks, Peter Cooper was positioned and prepped in the standard fashion. Cystoscopy was performed with a flexible cystoscope. The stent was grasped with flexible graspers and removed in its entirety. The patient tolerated the procedure well.  Findings: Uncomplicated stent removal  Assessment and Plan: We discussed general stone prevention strategies including adequate hydration with goal of producing 2.5 L of urine daily, increasing citric acid intake, increasing calcium intake during high oxalate meals, minimizing animal protein, and decreasing salt intake. Information about dietary recommendations given today.   Injection of Toradol given today for his severe stent pain  Follow up in 4 weeks with renal ultrasound to evaluate for silent hydronephrosis  Sondra Come, MD 03/06/2020

## 2020-03-06 NOTE — Telephone Encounter (Signed)
Called pt's wife per DPR. She states that pt is having right sided back pain, similar to the pain he had with stone. She states that pt is also very constipated, no complete BM in 9 days. The patient has tried ibuprofen for pain relief only, and has tried magnesium citrate on Friday for constipation in addition to senokot some loose stool after mag citrate. Wife states pt has not been taking oxybutynin as she thought it was the same thing as Flomax. Counseled wife on normal stent pain, advised pt to alternate tylenol and ibuprofen every 6 hours to avoid taking percocet as constipation is an issue. Advised pt to take remaining mag citrate as he has half a bottle left. Continue Senokot. Increase fluid intake. Resume oxybutynin. Call back for precautions below. Wife voiced understanding.    Possible Side Effects of Stents Blood in the urine (Hematuria). This can be tea-colored, pink or bright red; you may even notice some clots. The blood may come and go while you have the stent; this is normal.  If you have bright red blood that is very thick and lasts most of the day, you may need to contact the office and discuss this with your provider.  Drinking water will help clear the blood in your urine. Pain: There can be flank, side or back pain due to the stent. It may be worse with movement. There are medicines?that can help you with pain management. Urinary urgency and frequency: You may notice you have to urinate very quickly and very often. There are medications to alleviate these symptoms; please discuss this with your provider. Burning with urination: You may experience this with a stent. Medications can help with this symptom. CALL YOUR PHYSICIAN'S OFFICE IF YOU HAVE:  Fever of 101.5 or greater Constant urine leakage after the stent has been placed  A Medical Assistant or Triage nurse is available 24 hours a day for any medical questions or needs you may have.  The triage staff can help answer  questions you may have at any time of day or night. They can be reached at the Clinic's main number of [phone].

## 2020-03-07 LAB — URINALYSIS, COMPLETE
Bilirubin, UA: NEGATIVE
Glucose, UA: NEGATIVE
Ketones, UA: NEGATIVE
Nitrite, UA: NEGATIVE
Protein,UA: NEGATIVE
Specific Gravity, UA: 1.02 (ref 1.005–1.030)
Urobilinogen, Ur: 0.2 mg/dL (ref 0.2–1.0)
pH, UA: 7 (ref 5.0–7.5)

## 2020-03-07 LAB — MICROSCOPIC EXAMINATION
Epithelial Cells (non renal): NONE SEEN /hpf (ref 0–10)
RBC, Urine: >30 /hpf — AB (ref 0–2)

## 2020-03-07 LAB — CALCULI, WITH PHOTOGRAPH (CLINICAL LAB)
Calcium Oxalate Monohydrate: 100 %
Weight Calculi: 6 mg

## 2020-03-09 ENCOUNTER — Telehealth: Payer: Self-pay

## 2020-03-09 ENCOUNTER — Encounter: Payer: BC Managed Care – PPO | Admitting: Urology

## 2020-03-09 NOTE — Telephone Encounter (Signed)
Does not need anymore oxybuytnin. Would recommend scheduled flomax 0.4mg  x 14 days, and toradol 10mg  PRN q6 hours for pain x 8 tabs. Thanks  , MD 03/09/2020

## 2020-03-09 NOTE — Telephone Encounter (Signed)
Incoming call on triage line from patients wife concerning patients oxybutynin. She would like to know if patient should have anymore of this medication. Caller states patient had a pain episode yesterday which has subsided by today, caller concerned she will run out of this medication over the weekend. Caller states patient is still having bladder spasms. Please advise.

## 2020-03-10 ENCOUNTER — Telehealth: Payer: Self-pay | Admitting: Radiology

## 2020-03-10 MED ORDER — TAMSULOSIN HCL 0.4 MG PO CAPS
0.4000 mg | ORAL_CAPSULE | Freq: Every day | ORAL | 0 refills | Status: DC
Start: 2020-03-10 — End: 2020-04-26

## 2020-03-10 MED ORDER — KETOROLAC TROMETHAMINE 10 MG PO TABS: 10.0000 mg | ORAL_TABLET | Freq: Four times a day (QID) | ORAL | 0 refills | Status: DC | PRN

## 2020-03-10 NOTE — Telephone Encounter (Signed)
Wife reports that patient had surgery Thursday 03/02/2020 with Dr Richardo Hanks. Stent had to be removed early. He has had severe pain and states he was told it was bladder spasms. He is out of oxybutynin and would like recommendations? Wife can be reached at [phone].

## 2020-03-10 NOTE — Telephone Encounter (Signed)
Patient's wife notified and scripts sent into pharmacy

## 2020-03-10 NOTE — Telephone Encounter (Signed)
See previously telephone message, addressed

## 2020-03-10 NOTE — Addendum Note (Signed)
Addended by: Martha Clan on: 03/10/2020 10:16 AM   Modules accepted: Orders

## 2020-04-12 ENCOUNTER — Ambulatory Visit: Payer: BC Managed Care – PPO | Admitting: Urology

## 2020-04-12 ENCOUNTER — Other Ambulatory Visit: Payer: Self-pay

## 2020-04-12 DIAGNOSIS — N200 Calculus of kidney: Secondary | ICD-10-CM

## 2020-04-14 ENCOUNTER — Ambulatory Visit
Admission: RE | Admit: 2020-04-14 | Discharge: 2020-04-14 | Disposition: A | Discharge status: Home / Self Care | Payer: BC Managed Care – PPO | Source: Ambulatory Visit | Attending: Urology | Admitting: Urology

## 2020-04-14 ENCOUNTER — Other Ambulatory Visit: Payer: Self-pay

## 2020-04-14 DIAGNOSIS — N200 Calculus of kidney: Secondary | ICD-10-CM | POA: Insufficient documentation

## 2020-04-14 DIAGNOSIS — N202 Calculus of kidney with calculus of ureter: Secondary | ICD-10-CM | POA: Diagnosis not present

## 2020-04-26 ENCOUNTER — Encounter: Payer: Self-pay | Admitting: Urology

## 2020-04-26 ENCOUNTER — Ambulatory Visit (INDEPENDENT_AMBULATORY_CARE_PROVIDER_SITE_OTHER): Payer: BC Managed Care – PPO | Admitting: Urology

## 2020-04-26 ENCOUNTER — Other Ambulatory Visit: Payer: Self-pay

## 2020-04-26 VITALS — BP 155/104 | HR 72 | Ht 71.0 in | Wt 177.0 lb

## 2020-04-26 DIAGNOSIS — N200 Calculus of kidney: Secondary | ICD-10-CM

## 2020-04-26 NOTE — Progress Notes (Signed)
   04/26/2020 11:44 AM   Peter Cooper 1975/06/17 [id number]  Reason for visit: Follow up nephrolithiasis  HPI: I saw [name] in clinic for follow-up of nephrolithiasis.  He is a 44 year old male who underwent right ureteroscopy and laser lithotripsy of a 5 mm right distal ureteral stone on 03/02/2020.  He was having significant stent pain and we moved up his stent removal to 10/25.  He had some right-sided flank pain for a few days after stent removal, but has been doing well since that time.  He denies any gross hematuria or flank pain.  Stone analysis showed 100% calcium oxalate.  He has not had any prior stones before this.  I personally viewed and interpreted his renal ultrasound today that shows no hydronephrosis or nephrolithiasis.  We discussed general stone prevention strategies including adequate hydration with goal of producing 2.5 L of urine daily, increasing citric acid intake, increasing calcium intake during high oxalate meals, minimizing animal protein, and decreasing salt intake. Information about dietary recommendations given today.   Follow-up as needed   Sondra Come, MD  [facility] [address] [place], [place] [address] [phone]

## 2020-04-26 NOTE — Patient Instructions (Signed)
Dietary Guidelines to Help Prevent Kidney Stones Kidney stones are deposits of minerals and salts that form inside your kidneys. Your risk of developing kidney stones may be greater depending on your diet, your lifestyle, the medicines you take, and whether you have certain medical conditions. Most people can reduce their chances of developing kidney stones by following the instructions below. Depending on your overall health and the type of kidney stones you tend to develop, your dietitian may give you more specific instructions. What are tips for following this plan? Reading food labels  Choose foods with "no salt added" or "low-salt" labels. Limit your sodium intake to less than 1500 mg per day.  Choose foods with calcium for each meal and snack. Try to eat about 300 mg of calcium at each meal. Foods that contain 200-500 mg of calcium per serving include: ? 8 oz (237 ml) of milk, fortified nondairy milk, and fortified fruit juice. ? 8 oz (237 ml) of kefir, yogurt, and soy yogurt. ? 4 oz (118 ml) of tofu. ? 1 oz of cheese. ? 1 cup (300 g) of dried figs. ? 1 cup (91 g) of cooked broccoli. ? 1-3 oz can of sardines or mackerel.  Most people need 1000 to 1500 mg of calcium each day. Talk to your dietitian about how much calcium is recommended for you. Shopping  Buy plenty of fresh fruits and vegetables. Most people do not need to avoid fruits and vegetables, even if they contain nutrients that may contribute to kidney stones.  When shopping for convenience foods, choose: ? Whole pieces of fruit. ? Premade salads with dressing on the side. ? Low-fat fruit and yogurt smoothies.  Avoid buying frozen meals or prepared deli foods.  Look for foods with live cultures, such as yogurt and kefir. Cooking  Do not add salt to food when cooking. Place a salt shaker on the table and allow each person to add his or her own salt to taste.  Use vegetable protein, such as beans, textured vegetable  protein (TVP), or tofu instead of meat in pasta, casseroles, and soups. Meal planning   Eat less salt, if told by your dietitian. To do this: ? Avoid eating processed or premade food. ? Avoid eating fast food.  Eat less animal protein, including cheese, meat, poultry, or fish, if told by your dietitian. To do this: ? Limit the number of times you have meat, poultry, fish, or cheese each week. Eat a diet free of meat at least 2 days a week. ? Eat only one serving each day of meat, poultry, fish, or seafood. ? When you prepare animal protein, cut pieces into small portion sizes. For most meat and fish, one serving is about the size of one deck of cards.  Eat at least 5 servings of fresh fruits and vegetables each day. To do this: ? Keep fruits and vegetables on hand for snacks. ? Eat 1 piece of fruit or a handful of berries with breakfast. ? Have a salad and fruit at lunch. ? Have two kinds of vegetables at dinner.  Limit foods that are high in a substance called oxalate. These include: ? Spinach. ? Rhubarb. ? Beets. ? Potato chips and french fries. ? Nuts.  If you regularly take a diuretic medicine, make sure to eat at least 1-2 fruits or vegetables high in potassium each day. These include: ? Avocado. ? Banana. ? Orange, prune, carrot, or tomato juice. ? Baked potato. ? Cabbage. ? Beans and split   peas. General instructions   Drink enough fluid to keep your urine clear or pale yellow. This is the most important thing you can do.  Talk to your health care provider and dietitian about taking daily supplements. Depending on your health and the cause of your kidney stones, you may be advised: ? Not to take supplements with vitamin C. ? To take a calcium supplement. ? To take a daily probiotic supplement. ? To take other supplements such as magnesium, fish oil, or vitamin B6.  Take all medicines and supplements as told by your health care provider.  Limit alcohol intake to no  more than 1 drink a day for nonpregnant women and 2 drinks a day for men. One drink equals 12 oz of beer, 5 oz of wine, or 1 oz of hard liquor.  Lose weight if told by your health care provider. Work with your dietitian to find strategies and an eating plan that works best for you. What foods are not recommended? Limit your intake of the following foods, or as told by your dietitian. Talk to your dietitian about specific foods you should avoid based on the type of kidney stones and your overall health. Grains Breads. Bagels. Rolls. Baked goods. Salted crackers. Cereal. Pasta. Vegetables Spinach. Rhubarb. Beets. Canned vegetables. Pickles. Olives. Meats and other protein foods Nuts. Nut butters. Large portions of meat, poultry, or fish. Salted or cured meats. Deli meats. Hot dogs. Sausages. Dairy Cheese. Beverages Regular soft drinks. Regular vegetable juice. Seasonings and other foods Seasoning blends with salt. Salad dressings. Canned soups. Soy sauce. Ketchup. Barbecue sauce. Canned pasta sauce. Casseroles. Pizza. Lasagna. Frozen meals. Potato chips. French fries. Summary  You can reduce your risk of kidney stones by making changes to your diet.  The most important thing you can do is drink enough fluid. You should drink enough fluid to keep your urine clear or pale yellow.  Ask your health care provider or dietitian how much protein from animal sources you should eat each day, and also how much salt and calcium you should have each day. This information is not intended to replace advice given to you by your health care provider. Make sure you discuss any questions you have with your health care provider. Document Revised: 08/19/2018 Document Reviewed: 04/09/2016 Elsevier Patient Education  2020 [employer]  

## 2020-10-31 DIAGNOSIS — Z13220 Encounter for screening for lipoid disorders: Secondary | ICD-10-CM | POA: Diagnosis not present

## 2020-10-31 DIAGNOSIS — Z0000 Encounter for general adult medical examination without abnormal findings: Secondary | ICD-10-CM | POA: Diagnosis not present

## 2020-10-31 DIAGNOSIS — Z131 Encounter for screening for diabetes mellitus: Secondary | ICD-10-CM | POA: Diagnosis not present

## 2020-11-06 DIAGNOSIS — E663 Overweight: Secondary | ICD-10-CM | POA: Diagnosis not present

## 2020-11-06 DIAGNOSIS — Z0000 Encounter for general adult medical examination without abnormal findings: Secondary | ICD-10-CM | POA: Diagnosis not present

## 2020-11-06 DIAGNOSIS — Z6826 Body mass index (BMI) 26.0-26.9, adult: Secondary | ICD-10-CM | POA: Diagnosis not present

## 2020-11-06 DIAGNOSIS — Z1331 Encounter for screening for depression: Secondary | ICD-10-CM | POA: Diagnosis not present

## 2022-04-08 IMAGING — US US RENAL
1 series · 14 of 25 positions shown · non-contrast
Comparison: Previous CT of the abdomen and pelvis acquired on
02/28/2020

CLINICAL DATA: Nephrolithiasis. History of stone removal in Tuesday February, 2020 by report and removal of stent on 03/06/2020 from the RIGHT
ureter in this 44-year-old male

EXAM:
RENAL / URINARY TRACT ULTRASOUND COMPLETE

[Series 1: us renal · 14 of 33 slices shown]
[im 1/33]
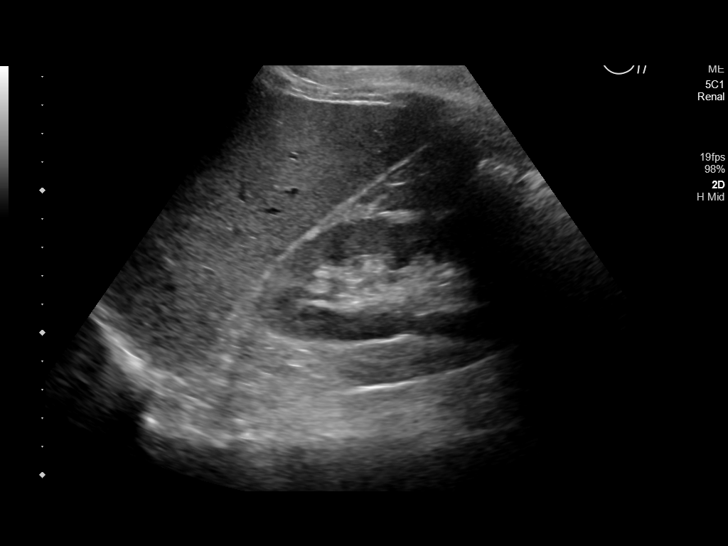
[im 3/33]
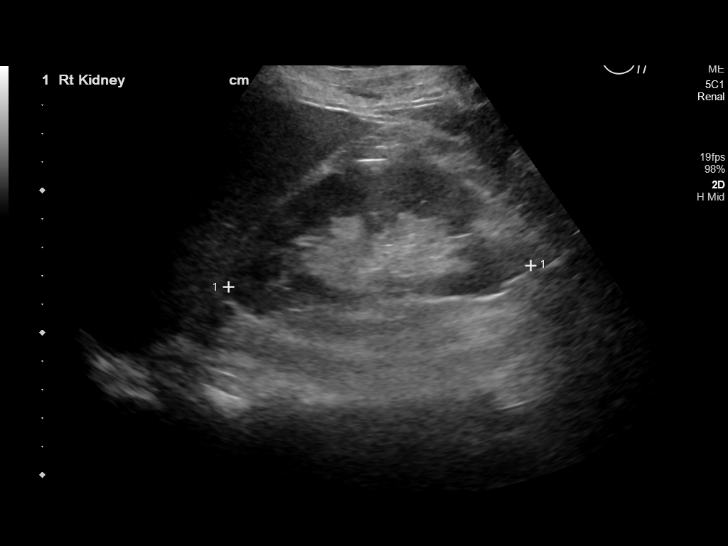
[im 6/33]
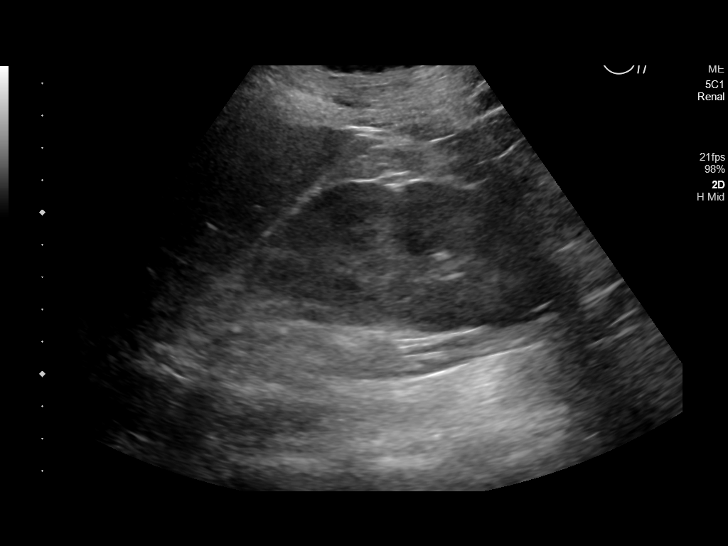
[im 9/33]
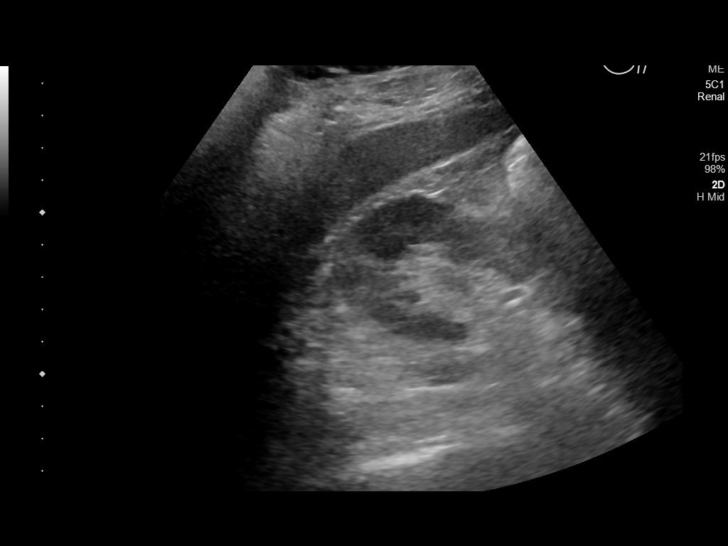
[im 11/33]
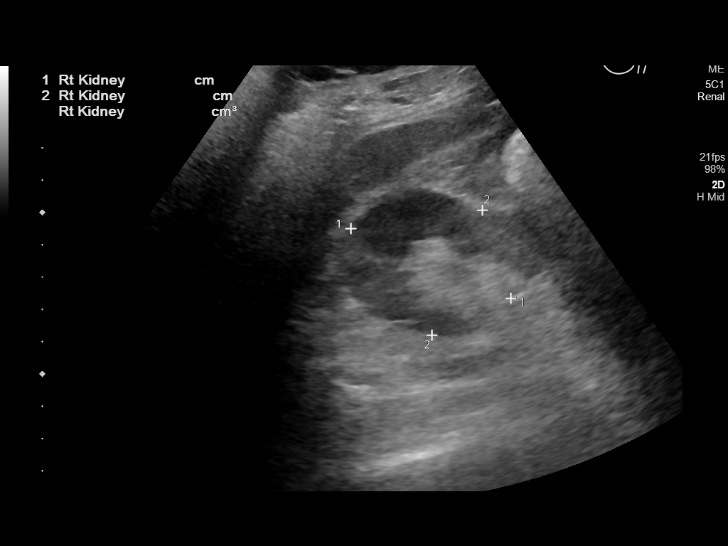
[im 13/33]
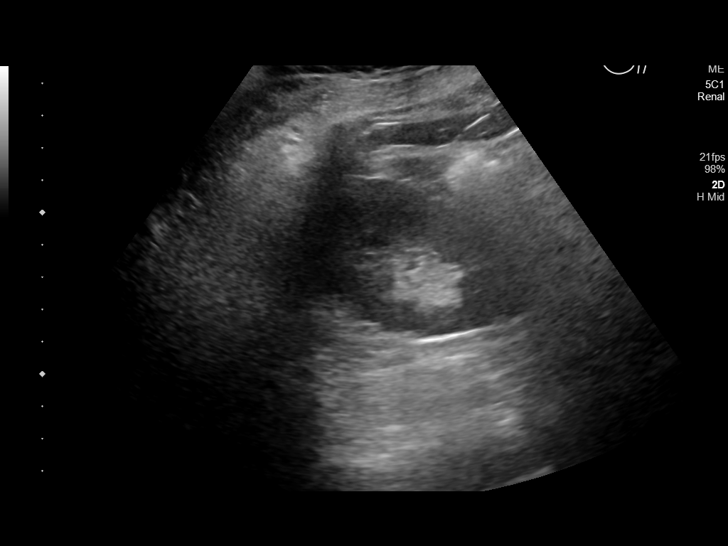
[im 15/33]
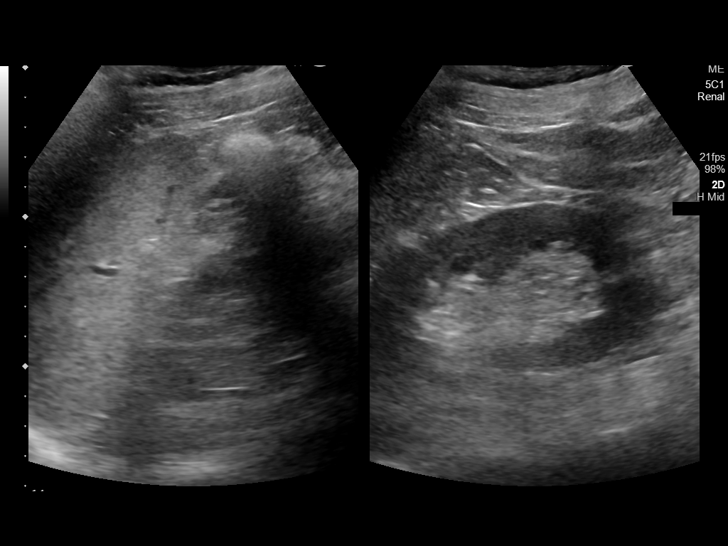
[im 18/33]
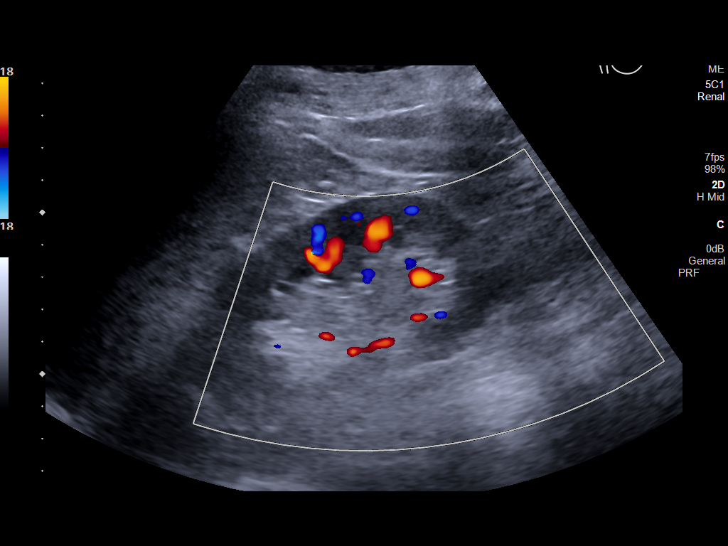
[im 21/33]
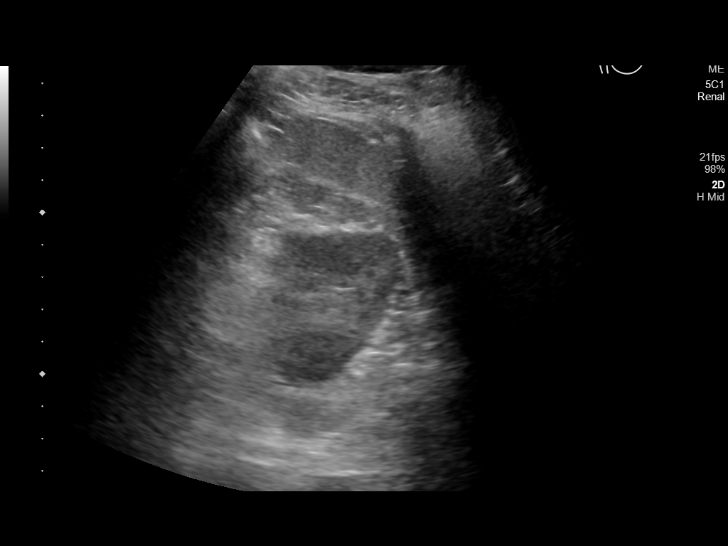
[im 22/33]
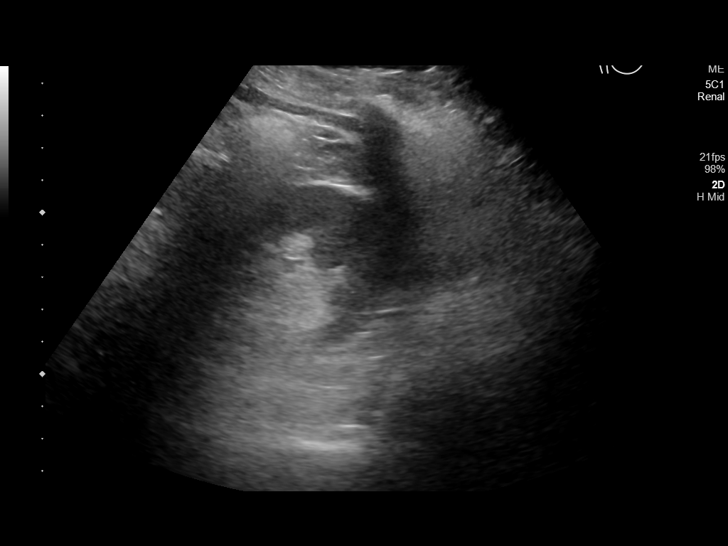
[im 25/33]
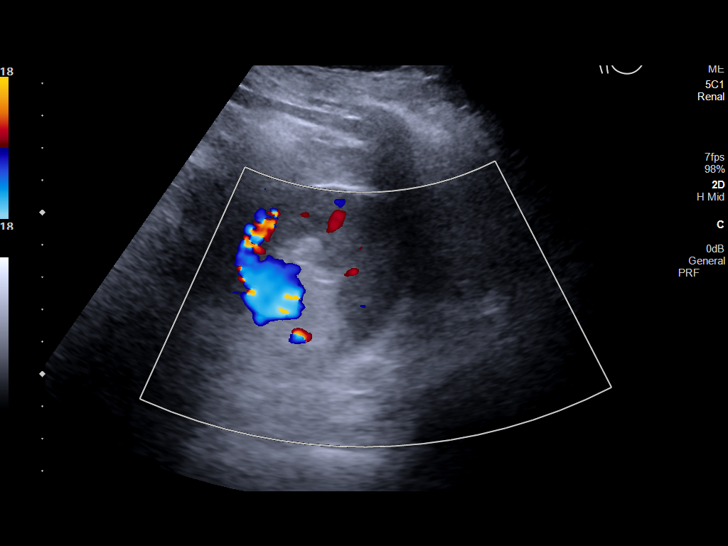
[im 27/33]
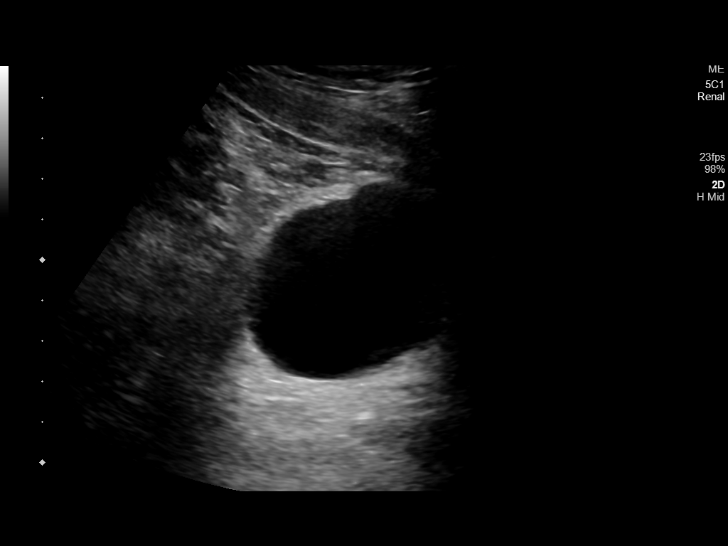
[im 30/33]
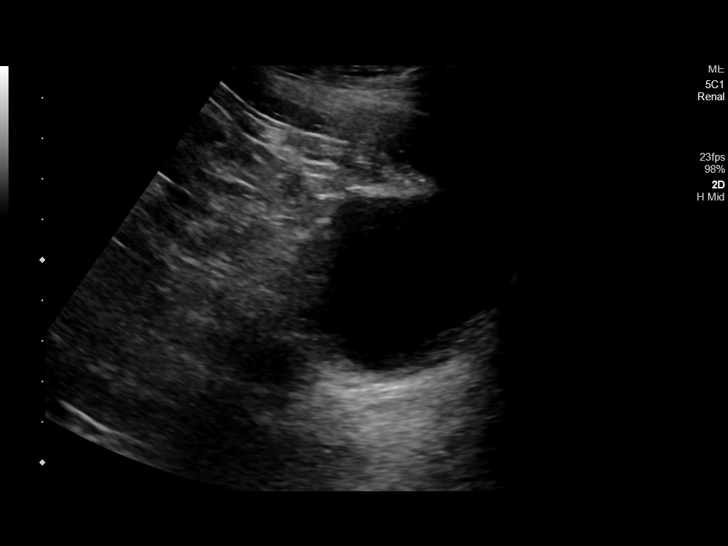
[im 33/33]
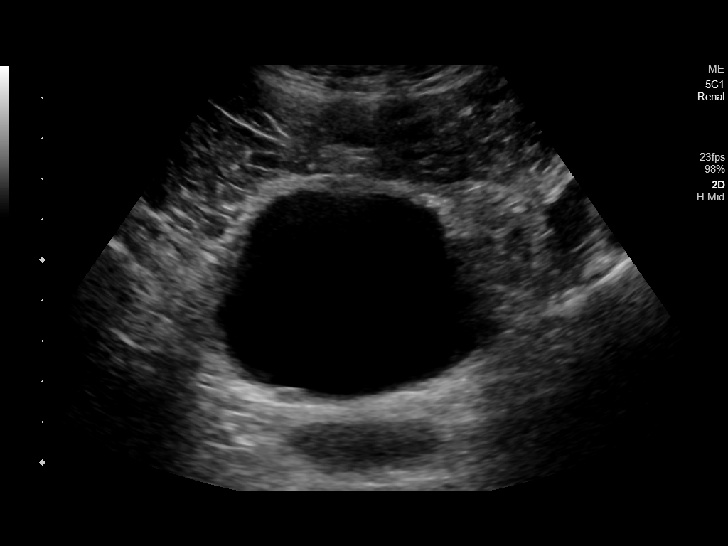

[14 of 25 positions shown; findings below may reference images not displayed]

FINDINGS: Right Kidney:

Renal measurements: 11.1 x 5.4 x 4.4 cm = volume: 140 mL.
Echogenicity within normal limits. No mass or hydronephrosis
visualized.

Left Kidney:

Renal measurements: 11.3 x 6.0 x 5.7 cm = volume: 205 mL.
Echogenicity within normal limits. No mass or hydronephrosis
visualized.

Bladder:

Appears normal for degree of bladder distention.

Other:

None.
IMPRESSION: Normal renal sonogram
